# Patient Record
Sex: Male | Born: 1970 | Race: White | Hispanic: No | Marital: Single
Health system: Southern US, Community
[De-identification: ages and names within clinical notes are randomized; demographics above are authoritative.]

---

## 1998-08-18 ENCOUNTER — Emergency Department (HOSPITAL_COMMUNITY): Admission: EM | Admit: 1998-08-18 | Discharge: 1998-08-18 | Payer: Self-pay | Admitting: Emergency Medicine

## 1998-09-02 ENCOUNTER — Emergency Department (HOSPITAL_COMMUNITY): Admission: EM | Admit: 1998-09-02 | Discharge: 1998-09-03 | Payer: Self-pay | Admitting: Emergency Medicine

## 2013-03-18 ENCOUNTER — Telehealth: Payer: Self-pay | Admitting: Gastroenterology

## 2013-03-18 NOTE — Telephone Encounter (Signed)
Wrong pt

## 2015-05-09 DIAGNOSIS — M109 Gout, unspecified: Secondary | ICD-10-CM | POA: Diagnosis not present

## 2015-06-02 DIAGNOSIS — M79672 Pain in left foot: Secondary | ICD-10-CM | POA: Diagnosis not present

## 2015-06-02 DIAGNOSIS — M25512 Pain in left shoulder: Secondary | ICD-10-CM | POA: Diagnosis not present

## 2015-08-05 DIAGNOSIS — M79672 Pain in left foot: Secondary | ICD-10-CM | POA: Diagnosis not present

## 2015-08-16 DIAGNOSIS — M109 Gout, unspecified: Secondary | ICD-10-CM | POA: Diagnosis not present

## 2015-08-18 DIAGNOSIS — M7661 Achilles tendinitis, right leg: Secondary | ICD-10-CM | POA: Diagnosis not present

## 2015-08-22 DIAGNOSIS — M7661 Achilles tendinitis, right leg: Secondary | ICD-10-CM | POA: Diagnosis not present

## 2015-08-24 DIAGNOSIS — M7661 Achilles tendinitis, right leg: Secondary | ICD-10-CM | POA: Diagnosis not present

## 2015-08-29 DIAGNOSIS — M7661 Achilles tendinitis, right leg: Secondary | ICD-10-CM | POA: Diagnosis not present

## 2015-09-05 DIAGNOSIS — M7661 Achilles tendinitis, right leg: Secondary | ICD-10-CM | POA: Diagnosis not present

## 2015-09-09 DIAGNOSIS — M7661 Achilles tendinitis, right leg: Secondary | ICD-10-CM | POA: Diagnosis not present

## 2015-09-15 DIAGNOSIS — M7661 Achilles tendinitis, right leg: Secondary | ICD-10-CM | POA: Diagnosis not present

## 2015-10-31 DIAGNOSIS — M109 Gout, unspecified: Secondary | ICD-10-CM | POA: Diagnosis not present

## 2015-11-14 DIAGNOSIS — M25512 Pain in left shoulder: Secondary | ICD-10-CM | POA: Diagnosis not present

## 2015-11-14 DIAGNOSIS — M79672 Pain in left foot: Secondary | ICD-10-CM | POA: Diagnosis not present

## 2015-11-16 DIAGNOSIS — M79672 Pain in left foot: Secondary | ICD-10-CM | POA: Diagnosis not present

## 2015-11-16 DIAGNOSIS — M25472 Effusion, left ankle: Secondary | ICD-10-CM | POA: Diagnosis not present

## 2015-11-22 DIAGNOSIS — M7661 Achilles tendinitis, right leg: Secondary | ICD-10-CM | POA: Diagnosis not present

## 2015-11-28 DIAGNOSIS — M25512 Pain in left shoulder: Secondary | ICD-10-CM | POA: Diagnosis not present

## 2015-11-28 DIAGNOSIS — M19012 Primary osteoarthritis, left shoulder: Secondary | ICD-10-CM | POA: Diagnosis not present

## 2015-12-09 DIAGNOSIS — Z23 Encounter for immunization: Secondary | ICD-10-CM | POA: Diagnosis not present

## 2015-12-12 DIAGNOSIS — M6702 Short Achilles tendon (acquired), left ankle: Secondary | ICD-10-CM | POA: Diagnosis not present

## 2015-12-12 DIAGNOSIS — K219 Gastro-esophageal reflux disease without esophagitis: Secondary | ICD-10-CM | POA: Diagnosis not present

## 2015-12-12 DIAGNOSIS — M7732 Calcaneal spur, left foot: Secondary | ICD-10-CM | POA: Diagnosis not present

## 2015-12-12 DIAGNOSIS — M7662 Achilles tendinitis, left leg: Secondary | ICD-10-CM | POA: Diagnosis not present

## 2015-12-12 DIAGNOSIS — Z79899 Other long term (current) drug therapy: Secondary | ICD-10-CM | POA: Diagnosis not present

## 2015-12-12 DIAGNOSIS — R2242 Localized swelling, mass and lump, left lower limb: Secondary | ICD-10-CM | POA: Diagnosis not present

## 2015-12-12 DIAGNOSIS — M25572 Pain in left ankle and joints of left foot: Secondary | ICD-10-CM | POA: Diagnosis not present

## 2015-12-19 DIAGNOSIS — K5900 Constipation, unspecified: Secondary | ICD-10-CM | POA: Diagnosis not present

## 2015-12-20 DIAGNOSIS — K5909 Other constipation: Secondary | ICD-10-CM | POA: Diagnosis not present

## 2015-12-20 DIAGNOSIS — K921 Melena: Secondary | ICD-10-CM | POA: Diagnosis not present

## 2015-12-20 DIAGNOSIS — K5730 Diverticulosis of large intestine without perforation or abscess without bleeding: Secondary | ICD-10-CM | POA: Diagnosis not present

## 2015-12-20 DIAGNOSIS — R110 Nausea: Secondary | ICD-10-CM | POA: Diagnosis not present

## 2015-12-20 DIAGNOSIS — K5900 Constipation, unspecified: Secondary | ICD-10-CM | POA: Diagnosis not present

## 2015-12-20 DIAGNOSIS — R1084 Generalized abdominal pain: Secondary | ICD-10-CM | POA: Diagnosis not present

## 2015-12-20 DIAGNOSIS — K219 Gastro-esophageal reflux disease without esophagitis: Secondary | ICD-10-CM | POA: Diagnosis not present

## 2015-12-21 DIAGNOSIS — M79672 Pain in left foot: Secondary | ICD-10-CM | POA: Diagnosis not present

## 2016-01-02 DIAGNOSIS — F4322 Adjustment disorder with anxiety: Secondary | ICD-10-CM | POA: Diagnosis not present

## 2016-01-04 DIAGNOSIS — Z4789 Encounter for other orthopedic aftercare: Secondary | ICD-10-CM | POA: Diagnosis not present

## 2016-01-04 DIAGNOSIS — M7661 Achilles tendinitis, right leg: Secondary | ICD-10-CM | POA: Diagnosis not present

## 2016-01-09 DIAGNOSIS — F4322 Adjustment disorder with anxiety: Secondary | ICD-10-CM | POA: Diagnosis not present

## 2016-01-25 DIAGNOSIS — M79672 Pain in left foot: Secondary | ICD-10-CM | POA: Diagnosis not present

## 2016-01-25 DIAGNOSIS — Z4789 Encounter for other orthopedic aftercare: Secondary | ICD-10-CM | POA: Diagnosis not present

## 2016-01-31 DIAGNOSIS — M7662 Achilles tendinitis, left leg: Secondary | ICD-10-CM | POA: Diagnosis not present

## 2016-01-31 DIAGNOSIS — R221 Localized swelling, mass and lump, neck: Secondary | ICD-10-CM | POA: Diagnosis not present

## 2016-02-01 DIAGNOSIS — M7662 Achilles tendinitis, left leg: Secondary | ICD-10-CM | POA: Diagnosis not present

## 2016-02-07 DIAGNOSIS — M109 Gout, unspecified: Secondary | ICD-10-CM | POA: Diagnosis not present

## 2016-02-08 DIAGNOSIS — M7662 Achilles tendinitis, left leg: Secondary | ICD-10-CM | POA: Diagnosis not present

## 2016-02-09 DIAGNOSIS — M7662 Achilles tendinitis, left leg: Secondary | ICD-10-CM | POA: Diagnosis not present

## 2016-02-13 DIAGNOSIS — M7662 Achilles tendinitis, left leg: Secondary | ICD-10-CM | POA: Diagnosis not present

## 2016-02-17 DIAGNOSIS — Z4789 Encounter for other orthopedic aftercare: Secondary | ICD-10-CM | POA: Diagnosis not present

## 2016-02-20 DIAGNOSIS — M7662 Achilles tendinitis, left leg: Secondary | ICD-10-CM | POA: Diagnosis not present

## 2016-02-22 DIAGNOSIS — M7662 Achilles tendinitis, left leg: Secondary | ICD-10-CM | POA: Diagnosis not present

## 2016-02-27 DIAGNOSIS — S46092A Other injury of muscle(s) and tendon(s) of the rotator cuff of left shoulder, initial encounter: Secondary | ICD-10-CM | POA: Diagnosis not present

## 2016-02-28 DIAGNOSIS — M7662 Achilles tendinitis, left leg: Secondary | ICD-10-CM | POA: Diagnosis not present

## 2016-02-29 DIAGNOSIS — M7662 Achilles tendinitis, left leg: Secondary | ICD-10-CM | POA: Diagnosis not present

## 2016-03-05 DIAGNOSIS — M7662 Achilles tendinitis, left leg: Secondary | ICD-10-CM | POA: Diagnosis not present

## 2016-03-07 DIAGNOSIS — M7662 Achilles tendinitis, left leg: Secondary | ICD-10-CM | POA: Diagnosis not present

## 2016-03-09 DIAGNOSIS — S46092A Other injury of muscle(s) and tendon(s) of the rotator cuff of left shoulder, initial encounter: Secondary | ICD-10-CM | POA: Diagnosis not present

## 2016-03-12 DIAGNOSIS — M7662 Achilles tendinitis, left leg: Secondary | ICD-10-CM | POA: Diagnosis not present

## 2016-03-14 DIAGNOSIS — M7662 Achilles tendinitis, left leg: Secondary | ICD-10-CM | POA: Diagnosis not present

## 2016-03-15 DIAGNOSIS — S46092A Other injury of muscle(s) and tendon(s) of the rotator cuff of left shoulder, initial encounter: Secondary | ICD-10-CM | POA: Diagnosis not present

## 2016-03-16 DIAGNOSIS — M79672 Pain in left foot: Secondary | ICD-10-CM | POA: Diagnosis not present

## 2016-03-19 DIAGNOSIS — M7662 Achilles tendinitis, left leg: Secondary | ICD-10-CM | POA: Diagnosis not present

## 2016-03-21 DIAGNOSIS — M7662 Achilles tendinitis, left leg: Secondary | ICD-10-CM | POA: Diagnosis not present

## 2016-03-22 DIAGNOSIS — S46092A Other injury of muscle(s) and tendon(s) of the rotator cuff of left shoulder, initial encounter: Secondary | ICD-10-CM | POA: Diagnosis not present

## 2016-03-26 DIAGNOSIS — M7662 Achilles tendinitis, left leg: Secondary | ICD-10-CM | POA: Diagnosis not present

## 2016-03-27 DIAGNOSIS — S46092A Other injury of muscle(s) and tendon(s) of the rotator cuff of left shoulder, initial encounter: Secondary | ICD-10-CM | POA: Diagnosis not present

## 2016-03-28 DIAGNOSIS — M7662 Achilles tendinitis, left leg: Secondary | ICD-10-CM | POA: Diagnosis not present

## 2016-04-02 DIAGNOSIS — M7662 Achilles tendinitis, left leg: Secondary | ICD-10-CM | POA: Diagnosis not present

## 2016-04-04 DIAGNOSIS — S46092A Other injury of muscle(s) and tendon(s) of the rotator cuff of left shoulder, initial encounter: Secondary | ICD-10-CM | POA: Diagnosis not present

## 2016-04-06 DIAGNOSIS — M7662 Achilles tendinitis, left leg: Secondary | ICD-10-CM | POA: Diagnosis not present

## 2016-04-09 DIAGNOSIS — M7662 Achilles tendinitis, left leg: Secondary | ICD-10-CM | POA: Diagnosis not present

## 2016-04-10 DIAGNOSIS — S46092A Other injury of muscle(s) and tendon(s) of the rotator cuff of left shoulder, initial encounter: Secondary | ICD-10-CM | POA: Diagnosis not present

## 2016-04-11 DIAGNOSIS — M7662 Achilles tendinitis, left leg: Secondary | ICD-10-CM | POA: Diagnosis not present

## 2016-04-17 DIAGNOSIS — M7662 Achilles tendinitis, left leg: Secondary | ICD-10-CM | POA: Diagnosis not present

## 2016-04-18 DIAGNOSIS — R109 Unspecified abdominal pain: Secondary | ICD-10-CM | POA: Diagnosis not present

## 2016-04-18 DIAGNOSIS — N410 Acute prostatitis: Secondary | ICD-10-CM | POA: Diagnosis not present

## 2016-04-23 DIAGNOSIS — M7662 Achilles tendinitis, left leg: Secondary | ICD-10-CM | POA: Diagnosis not present

## 2016-04-24 DIAGNOSIS — S46092A Other injury of muscle(s) and tendon(s) of the rotator cuff of left shoulder, initial encounter: Secondary | ICD-10-CM | POA: Diagnosis not present

## 2016-04-25 DIAGNOSIS — M7662 Achilles tendinitis, left leg: Secondary | ICD-10-CM | POA: Diagnosis not present

## 2016-05-01 DIAGNOSIS — M7662 Achilles tendinitis, left leg: Secondary | ICD-10-CM | POA: Diagnosis not present

## 2016-05-02 DIAGNOSIS — S46092A Other injury of muscle(s) and tendon(s) of the rotator cuff of left shoulder, initial encounter: Secondary | ICD-10-CM | POA: Diagnosis not present

## 2016-05-03 DIAGNOSIS — M7662 Achilles tendinitis, left leg: Secondary | ICD-10-CM | POA: Diagnosis not present

## 2016-05-09 DIAGNOSIS — M7662 Achilles tendinitis, left leg: Secondary | ICD-10-CM | POA: Diagnosis not present

## 2016-05-10 DIAGNOSIS — M7662 Achilles tendinitis, left leg: Secondary | ICD-10-CM | POA: Diagnosis not present

## 2016-05-15 DIAGNOSIS — M7662 Achilles tendinitis, left leg: Secondary | ICD-10-CM | POA: Diagnosis not present

## 2016-05-17 DIAGNOSIS — M7662 Achilles tendinitis, left leg: Secondary | ICD-10-CM | POA: Diagnosis not present

## 2016-05-25 DIAGNOSIS — M7662 Achilles tendinitis, left leg: Secondary | ICD-10-CM | POA: Diagnosis not present

## 2016-05-29 DIAGNOSIS — M79672 Pain in left foot: Secondary | ICD-10-CM | POA: Diagnosis not present

## 2016-09-14 DIAGNOSIS — M79672 Pain in left foot: Secondary | ICD-10-CM | POA: Diagnosis not present

## 2016-10-18 DIAGNOSIS — M25522 Pain in left elbow: Secondary | ICD-10-CM | POA: Diagnosis not present

## 2016-10-23 DIAGNOSIS — F4322 Adjustment disorder with anxiety: Secondary | ICD-10-CM | POA: Diagnosis not present

## 2016-10-26 DIAGNOSIS — M79672 Pain in left foot: Secondary | ICD-10-CM | POA: Diagnosis not present

## 2016-11-01 DIAGNOSIS — F4322 Adjustment disorder with anxiety: Secondary | ICD-10-CM | POA: Diagnosis not present

## 2016-11-12 DIAGNOSIS — F4322 Adjustment disorder with anxiety: Secondary | ICD-10-CM | POA: Diagnosis not present

## 2016-11-16 DIAGNOSIS — M79672 Pain in left foot: Secondary | ICD-10-CM | POA: Diagnosis not present

## 2016-11-26 DIAGNOSIS — F4322 Adjustment disorder with anxiety: Secondary | ICD-10-CM | POA: Diagnosis not present

## 2016-12-06 DIAGNOSIS — Z23 Encounter for immunization: Secondary | ICD-10-CM | POA: Diagnosis not present

## 2016-12-14 DIAGNOSIS — M79672 Pain in left foot: Secondary | ICD-10-CM | POA: Diagnosis not present

## 2016-12-24 DIAGNOSIS — F4322 Adjustment disorder with anxiety: Secondary | ICD-10-CM | POA: Diagnosis not present

## 2017-02-04 DIAGNOSIS — F4322 Adjustment disorder with anxiety: Secondary | ICD-10-CM | POA: Diagnosis not present

## 2017-03-04 DIAGNOSIS — F4322 Adjustment disorder with anxiety: Secondary | ICD-10-CM | POA: Diagnosis not present

## 2017-03-06 DIAGNOSIS — J101 Influenza due to other identified influenza virus with other respiratory manifestations: Secondary | ICD-10-CM | POA: Diagnosis not present

## 2017-03-20 DIAGNOSIS — F4323 Adjustment disorder with mixed anxiety and depressed mood: Secondary | ICD-10-CM | POA: Diagnosis not present

## 2017-04-01 DIAGNOSIS — F4322 Adjustment disorder with anxiety: Secondary | ICD-10-CM | POA: Diagnosis not present

## 2017-04-04 DIAGNOSIS — F4323 Adjustment disorder with mixed anxiety and depressed mood: Secondary | ICD-10-CM | POA: Diagnosis not present

## 2017-06-10 DIAGNOSIS — F4322 Adjustment disorder with anxiety: Secondary | ICD-10-CM | POA: Diagnosis not present

## 2017-07-05 DIAGNOSIS — M1A0790 Idiopathic chronic gout, unspecified ankle and foot, without tophus (tophi): Secondary | ICD-10-CM | POA: Diagnosis not present

## 2017-07-05 DIAGNOSIS — R1084 Generalized abdominal pain: Secondary | ICD-10-CM | POA: Diagnosis not present

## 2017-07-08 DIAGNOSIS — F4322 Adjustment disorder with anxiety: Secondary | ICD-10-CM | POA: Diagnosis not present

## 2017-07-24 DIAGNOSIS — F4322 Adjustment disorder with anxiety: Secondary | ICD-10-CM | POA: Diagnosis not present

## 2017-08-28 DIAGNOSIS — F4322 Adjustment disorder with anxiety: Secondary | ICD-10-CM | POA: Diagnosis not present

## 2017-09-14 DIAGNOSIS — M25561 Pain in right knee: Secondary | ICD-10-CM | POA: Diagnosis not present

## 2017-09-17 DIAGNOSIS — R194 Change in bowel habit: Secondary | ICD-10-CM | POA: Diagnosis not present

## 2017-09-25 DIAGNOSIS — F4322 Adjustment disorder with anxiety: Secondary | ICD-10-CM | POA: Diagnosis not present

## 2017-09-26 DIAGNOSIS — R194 Change in bowel habit: Secondary | ICD-10-CM | POA: Diagnosis not present

## 2017-09-27 DIAGNOSIS — M25562 Pain in left knee: Secondary | ICD-10-CM | POA: Diagnosis not present

## 2017-10-04 DIAGNOSIS — F4322 Adjustment disorder with anxiety: Secondary | ICD-10-CM | POA: Diagnosis not present

## 2017-10-07 DIAGNOSIS — I808 Phlebitis and thrombophlebitis of other sites: Secondary | ICD-10-CM | POA: Diagnosis not present

## 2017-10-09 DIAGNOSIS — R2232 Localized swelling, mass and lump, left upper limb: Secondary | ICD-10-CM | POA: Diagnosis not present

## 2017-10-09 DIAGNOSIS — R590 Localized enlarged lymph nodes: Secondary | ICD-10-CM | POA: Diagnosis not present

## 2017-10-09 DIAGNOSIS — M79602 Pain in left arm: Secondary | ICD-10-CM | POA: Diagnosis not present

## 2017-10-09 DIAGNOSIS — G8911 Acute pain due to trauma: Secondary | ICD-10-CM | POA: Diagnosis not present

## 2017-10-09 DIAGNOSIS — S40022A Contusion of left upper arm, initial encounter: Secondary | ICD-10-CM | POA: Diagnosis not present

## 2017-10-09 DIAGNOSIS — Z79899 Other long term (current) drug therapy: Secondary | ICD-10-CM | POA: Diagnosis not present

## 2017-10-15 DIAGNOSIS — T148XXA Other injury of unspecified body region, initial encounter: Secondary | ICD-10-CM | POA: Diagnosis not present

## 2017-10-23 DIAGNOSIS — F4322 Adjustment disorder with anxiety: Secondary | ICD-10-CM | POA: Diagnosis not present

## 2017-10-31 DIAGNOSIS — R197 Diarrhea, unspecified: Secondary | ICD-10-CM | POA: Diagnosis not present

## 2017-11-07 DIAGNOSIS — F4322 Adjustment disorder with anxiety: Secondary | ICD-10-CM | POA: Diagnosis not present

## 2017-11-28 DIAGNOSIS — F4322 Adjustment disorder with anxiety: Secondary | ICD-10-CM | POA: Diagnosis not present

## 2017-12-02 DIAGNOSIS — R51 Headache: Secondary | ICD-10-CM | POA: Diagnosis not present

## 2017-12-02 DIAGNOSIS — R5383 Other fatigue: Secondary | ICD-10-CM | POA: Diagnosis not present

## 2017-12-02 DIAGNOSIS — B349 Viral infection, unspecified: Secondary | ICD-10-CM | POA: Diagnosis not present

## 2017-12-11 DIAGNOSIS — Z23 Encounter for immunization: Secondary | ICD-10-CM | POA: Diagnosis not present

## 2017-12-18 DIAGNOSIS — F4322 Adjustment disorder with anxiety: Secondary | ICD-10-CM | POA: Diagnosis not present

## 2018-01-14 DIAGNOSIS — F4324 Adjustment disorder with disturbance of conduct: Secondary | ICD-10-CM | POA: Diagnosis not present

## 2018-01-30 DIAGNOSIS — F4324 Adjustment disorder with disturbance of conduct: Secondary | ICD-10-CM | POA: Diagnosis not present

## 2018-03-11 DIAGNOSIS — F4324 Adjustment disorder with disturbance of conduct: Secondary | ICD-10-CM | POA: Diagnosis not present

## 2018-03-20 DIAGNOSIS — F4323 Adjustment disorder with mixed anxiety and depressed mood: Secondary | ICD-10-CM | POA: Diagnosis not present

## 2018-04-08 DIAGNOSIS — F4322 Adjustment disorder with anxiety: Secondary | ICD-10-CM | POA: Diagnosis not present

## 2018-04-21 DIAGNOSIS — F4322 Adjustment disorder with anxiety: Secondary | ICD-10-CM | POA: Diagnosis not present

## 2018-05-29 DIAGNOSIS — F4322 Adjustment disorder with anxiety: Secondary | ICD-10-CM | POA: Diagnosis not present

## 2018-09-25 DIAGNOSIS — L821 Other seborrheic keratosis: Secondary | ICD-10-CM | POA: Diagnosis not present

## 2018-09-25 DIAGNOSIS — D485 Neoplasm of uncertain behavior of skin: Secondary | ICD-10-CM | POA: Diagnosis not present

## 2018-09-25 DIAGNOSIS — D1801 Hemangioma of skin and subcutaneous tissue: Secondary | ICD-10-CM | POA: Diagnosis not present

## 2018-10-21 DIAGNOSIS — Z20828 Contact with and (suspected) exposure to other viral communicable diseases: Secondary | ICD-10-CM | POA: Diagnosis not present

## 2018-10-21 DIAGNOSIS — Z6830 Body mass index (BMI) 30.0-30.9, adult: Secondary | ICD-10-CM | POA: Diagnosis not present

## 2018-11-06 DIAGNOSIS — D485 Neoplasm of uncertain behavior of skin: Secondary | ICD-10-CM | POA: Diagnosis not present

## 2018-12-12 ENCOUNTER — Ambulatory Visit: Payer: BC Managed Care – PPO | Admitting: Family Medicine

## 2018-12-12 ENCOUNTER — Ambulatory Visit (INDEPENDENT_AMBULATORY_CARE_PROVIDER_SITE_OTHER)
Admission: RE | Admit: 2018-12-12 | Discharge: 2018-12-12 | Disposition: A | Discharge status: Home / Self Care | Payer: BC Managed Care – PPO | Source: Ambulatory Visit | Attending: Family Medicine | Admitting: Family Medicine

## 2018-12-12 ENCOUNTER — Encounter: Payer: Self-pay | Admitting: Family Medicine

## 2018-12-12 ENCOUNTER — Other Ambulatory Visit: Payer: Self-pay

## 2018-12-12 VITALS — BP 110/72 | HR 69 | Ht 74.0 in

## 2018-12-12 DIAGNOSIS — G8929 Other chronic pain: Secondary | ICD-10-CM | POA: Diagnosis not present

## 2018-12-12 DIAGNOSIS — M25561 Pain in right knee: Secondary | ICD-10-CM

## 2018-12-12 DIAGNOSIS — M25562 Pain in left knee: Secondary | ICD-10-CM

## 2018-12-12 MED ORDER — VITAMIN D (ERGOCALCIFEROL) 1.25 MG (50000 UNIT) PO CAPS: 50000.0000 [IU] | ORAL_CAPSULE | ORAL | 0 refills | Status: DC

## 2018-12-12 NOTE — Progress Notes (Signed)
[facility] [address] [place], [place] [address] Phone: [phone] Subjective:   IJacqualin Combes, am serving as a scribe for Dr. Hulan Saas.'   CC: Left knee pain  NUU:[id number]  Nicholas Watkins is a 48 y.o. male coming in with complaint of left knee pain since 4 weeks ago when playing ultimate frisbee. Pain over medial hamstring tendons. Played ultimate one week later and was hobbling afterwards. Patient walks on hard concrete floors. Pain has improved but is still occurring daily. Pain is sharp in character. Has been playing sports his entire life and has suffered other knee injuries. Has been told he has arthritis.       Social History   Socioeconomic History  . Marital status: Single    Spouse name: Not on file  . Number of children: Not on file  . Years of education: Not on file  . Highest education level: Not on file  Occupational History  . Not on file  Social Needs  . Financial resource strain: Not on file  . Food insecurity    Worry: Not on file    Inability: Not on file  . Transportation needs    Medical: Not on file    Non-medical: Not on file  Tobacco Use  . Smoking status: Not on file  Substance and Sexual Activity  . Alcohol use: Not on file  . Drug use: Not on file  . Sexual activity: Not on file  Lifestyle  . Physical activity    Days per week: Not on file    Minutes per session: Not on file  . Stress: Not on file  Relationships  . Social Herbalist on phone: Not on file    Gets together: Not on file    Attends religious service: Not on file    Active member of club or organization: Not on file    Attends meetings of clubs or organizations: Not on file    Relationship status: Not on file  Other Topics Concern  . Not on file  Social History Narrative  . Not on file   Not on File no known drug allergies No family history on file.  No family history of autoimmune       Current Outpatient  Medications (Other):  .  omeprazole (PRILOSEC) 10 MG capsule, Take 10 mg by mouth daily. .  Vitamin D, Ergocalciferol, (DRISDOL) 1.25 MG (50000 UT) CAPS capsule, Take 1 capsule (50,000 Units total) by mouth every 7 (seven) days.    Past medical history, social, surgical and family history all reviewed in electronic medical record.  No pertanent information unless stated regarding to the chief complaint.   Review of Systems:  No headache, visual changes, nausea, vomiting, diarrhea, constipation, dizziness, abdominal pain, skin rash, fevers, chills, night sweats, weight loss, swollen lymph nodes, body aches, joint swelling, chest pain, shortness of breath, mood changes.  Positive muscle aches  Objective  Blood pressure 110/72, pulse 69, height 6\' 2"  (1.88 m), SpO2 99 %. Systems examined below as of    General: No apparent distress alert and oriented x3 mood and affect normal, dressed appropriately.  HEENT: Pupils equal, extraocular movements intact  Respiratory: Patient's speak in full sentences and does not appear short of breath  Cardiovascular: No lower extremity edema, non tender, no erythema  Skin: Warm dry intact with no signs of infection or rash on extremities or on axial skeleton.  Abdomen: Soft nontender  Neuro: Cranial nerves II through XII are intact, neurovascularly intact in all extremities with 2+ DTRs and 2+ pulses.  Lymph: No lymphadenopathy of posterior or anterior cervical chain or axillae bilaterally.  Gait normal with good balance and coordination.  MSK:  Non tender with full range of motion and good stability and symmetric strength and tone of shoulders, elbows, wrist, hip, and ankles bilaterally.  Left knee exam shows the patient does have tender to palpation more over the hamstring tendon posteriorly to the medial joint line.  Patient does have some mild pain with resisted flexion of the knee.  Severe tenderness to palpation in this area.  Minimal pain over the joint  itself.  No crepitus.  No instability.  Negative McMurray's.  Limited musculoskeletal ultrasound was performed and interpreted by Judi Saa  Limited ultrasound of patient's medial joint line shows some mild narrowing, patient has mild narrowing of the patellofemoral joint.  Hamstring tendon intact but does have what appears to be as very small bursitis noted. Impression: Bursitis of the right knee medially   Impression and Recommendations:     This case required medical decision making of moderate complexity. The above documentation has been reviewed and is accurate and complete Judi Saa, DO       Note: This dictation was prepared with Dragon dictation along with smaller phrase technology. Any transcriptional errors that result from this process are unintentional.

## 2018-12-12 NOTE — Assessment & Plan Note (Signed)
Left knee pain.  Seems to be more of a bursitis on the medial aspect of the knee which is fairly unusual.  No significant bone mass noted and no abnormal vascularity.  Differential includes a posterior medial meniscal tear but did not see a truly on ultrasound today.  Patient does have some mild arthritis that could be contributing.  We discussed thigh compression sleeve, home exercise, topical anti-inflammatories.  X-rays pending.  I believe patient will do well with conservative therapy but if not had follow-up consider ultrasounding again and possible formal physical therapy.

## 2018-12-12 NOTE — Patient Instructions (Addendum)
Good to see you  Ice is yoru friend Thigh compression sleeve Once weekly Vitamin D See me in 5-6 weeks

## 2019-01-12 NOTE — Progress Notes (Signed)
[facility] [address] [place], [place] [address] Phone: [phone] Subjective:   I [name] am serving as a Education administrator for Dr. Hulan Saas.  This visit occurred during the SARS-CoV-2 public health emergency.  Safety protocols were in place, including screening questions prior to the visit, additional usage of staff PPE, and extensive cleaning of exam room while observing appropriate contact time as indicated for disinfecting solutions.   I'm seeing this patient by the request  of:  Orpah Melter, MD   CC: Knee pain follow-up  QAS:[id number]   12/12/2018 Left knee pain.  Seems to be more of a bursitis on the medial aspect of the knee which is fairly unusual.  No significant bone mass noted and no abnormal vascularity.  Differential includes a posterior medial meniscal tear but did not see a truly on ultrasound today.  Patient does have some mild arthritis that could be contributing.  We discussed thigh compression sleeve, home exercise, topical anti-inflammatories.  X-rays pending.  I believe patient will do well with conservative therapy but if not had follow-up consider ultrasounding again and possible formal physical therapy.  Update 01/13/2019 OMRAN Watkins is a 48 y.o. male coming in with complaint of bilateral knee pain. Patient states the knees are doing well. Patient believes he is making progress. Still experiencing pain with going up and down stairs.  Patient states that the pain seems to be left more than right.     Patient was previously seen and had more of a bursitis in the medial knee.  No past medical history on file. No past surgical history on file. Social History   Socioeconomic History  . Marital status: Single    Spouse name: Not on file  . Number of children: Not on file  . Years of education: Not on file  . Highest education level: Not on file  Occupational History  . Not on file  Tobacco Use  . Smoking status: Not  on file  Substance and Sexual Activity  . Alcohol use: Not on file  . Drug use: Not on file  . Sexual activity: Not on file  Other Topics Concern  . Not on file  Social History Narrative  . Not on file   Social Determinants of Health   Financial Resource Strain:   . Difficulty of Paying Living Expenses: Not on file  Food Insecurity:   . Worried About Charity fundraiser in the Last Year: Not on file  . Ran Out of Food in the Last Year: Not on file  Transportation Needs:   . Lack of Transportation (Medical): Not on file  . Lack of Transportation (Non-Medical): Not on file  Physical Activity:   . Days of Exercise per Week: Not on file  . Minutes of Exercise per Session: Not on file  Stress:   . Feeling of Stress : Not on file  Social Connections:   . Frequency of Communication with Friends and Family: Not on file  . Frequency of Social Gatherings with Friends and Family: Not on file  . Attends Religious Services: Not on file  . Active Member of Clubs or Organizations: Not on file  . Attends Archivist Meetings: Not on file  . Marital Status: Not on file   Not on File No family history on file.       Current Outpatient Medications (Other):  .  omeprazole (PRILOSEC) 10 MG capsule, Take 10 mg by mouth daily. Marland Kitchen  Vitamin D, Ergocalciferol, (DRISDOL) 1.25 MG (50000 UT) CAPS capsule, Take 1 capsule (50,000 Units total) by mouth every 7 (seven) days.    Past medical history, social, surgical and family history all reviewed in electronic medical record.  No pertanent information unless stated regarding to the chief complaint.   Review of Systems:  No headache, visual changes, nausea, vomiting, diarrhea, constipation, dizziness, abdominal pain, skin rash, fevers, chills, night sweats, weight loss, swollen lymph nodes, body aches, joint swelling, muscle aches, chest pain, shortness of breath, mood changes.   Objective  Blood pressure 110/70, pulse 81, height 6\' 2"   (1.88 m), weight 247 lb (112 kg), SpO2 99 %.   General: No apparent distress alert and oriented x3 mood and affect normal, dressed appropriately.  HEENT: Pupils equal, extraocular movements intact  Respiratory: Patient's speak in full sentences and does not appear short of breath  Cardiovascular: No lower extremity edema, non tender, no erythema  Skin: Warm dry intact with no signs of infection or rash on extremities or on axial skeleton.  Abdomen: Soft nontender  Neuro: Cranial nerves II through XII are intact, neurovascularly intact in all extremities with 2+ DTRs and 2+ pulses.  Lymph: No lymphadenopathy of posterior or anterior cervical chain or axillae bilaterally.  Gait normal with good balance and coordination.  MSK:  tender with range of motion and good stability and symmetric strength and tone of shoulders, elbows, wrist, hip and ankles bilaterally.  Right knee shows some patellofemoral grinding noted.  Positive patellar grind with mild lateral tracking noted.  Minimal tenderness noted to palpation.  Left knee has more pain on the medial aspect positive McMurray's. Patient still has some tightness of the hamstring compared to the contralateral side.  Limited musculoskeletal ultrasound was performed and interpreted by  Did ultrasound of patient's left knee shows that patient still has the cystic formation noted but now appears to be more of a perimeniscal cyst.  Likely secondary to a degenerative meniscal tear posteriorly with mild displacement noted.  This is different than previous exam but less hypoechoic changes in the area  After informed written and verbal consent, patient was seated on exam table. Left knee was prepped with alcohol swab and utilizing anterolateral approach, patient's left knee space was injected with 4:1  marcaine 0.5%: Kenalog 40mg /dL. Patient tolerated the procedure well without immediate complications.   Impression and Recommendations:      This case required medical decision making of moderate complexity. The above documentation has been reviewed and is accurate and complete Judi Saa, DO       Note: This dictation was prepared with Dragon dictation along with smaller phrase technology. Any transcriptional errors that result from this process are unintentional.

## 2019-01-13 ENCOUNTER — Ambulatory Visit: Payer: Self-pay

## 2019-01-13 ENCOUNTER — Encounter: Payer: Self-pay | Admitting: Family Medicine

## 2019-01-13 ENCOUNTER — Ambulatory Visit: Payer: BC Managed Care – PPO | Admitting: Family Medicine

## 2019-01-13 ENCOUNTER — Other Ambulatory Visit: Payer: Self-pay

## 2019-01-13 VITALS — BP 110/70 | HR 81 | Ht 74.0 in | Wt 247.0 lb

## 2019-01-13 DIAGNOSIS — M25562 Pain in left knee: Secondary | ICD-10-CM

## 2019-01-13 DIAGNOSIS — G8929 Other chronic pain: Secondary | ICD-10-CM

## 2019-01-13 NOTE — Assessment & Plan Note (Signed)
Patient given an injection today, tolerated the procedure well.  I do believe that there is a potential for complex medial posterior meniscal tear noted on ultrasound today.  Discussed which activities to doing which wants to avoid.  Follow-up again in 4 to 8 weeks

## 2019-01-13 NOTE — Patient Instructions (Addendum)
  [address], [address] [place],  [address] Phone [phone]   Good to see you Happy Holidays! See me again in 5-6 weeks   

## 2019-02-17 ENCOUNTER — Encounter: Payer: Self-pay | Admitting: Family Medicine

## 2019-02-17 ENCOUNTER — Ambulatory Visit: Payer: BC Managed Care – PPO | Admitting: Family Medicine

## 2019-02-17 ENCOUNTER — Other Ambulatory Visit: Payer: Self-pay

## 2019-02-17 DIAGNOSIS — M25562 Pain in left knee: Secondary | ICD-10-CM

## 2019-02-17 DIAGNOSIS — M545 Low back pain, unspecified: Secondary | ICD-10-CM | POA: Insufficient documentation

## 2019-02-17 DIAGNOSIS — G8929 Other chronic pain: Secondary | ICD-10-CM

## 2019-02-17 NOTE — Patient Instructions (Signed)
See me in 2 months Stretch after lifting

## 2019-02-17 NOTE — Assessment & Plan Note (Signed)
Low back pain likely multifactorial.  New problem for him.  Home exercises given today, discussed icing regimen.  Discussed proper lifting techniques.  Patient is worsening pain will see me again in 6 weeks

## 2019-02-17 NOTE — Assessment & Plan Note (Signed)
Patient is doing much better after the injection.  Patient has increased activity.  Has had some mild back pain recently.  New exercises for the hip flexor given.  Discussed which activities to do which was to avoid.  Patient is to increase activity as tolerated.  Patient can follow-up again in 2 months if not completely better.

## 2019-02-17 NOTE — Progress Notes (Signed)
[facility] [address] [place] [address] Phone: [phone] Subjective:   I Nicholas Watkins am serving as a Neurosurgeon for Dr. Antoine Primas.  This visit occurred during the SARS-CoV-2 public health emergency.  Safety protocols were in place, including screening questions prior to the visit, additional usage of staff PPE, and extensive cleaning of exam room while observing appropriate contact time as indicated for disinfecting solutions.   I'm seeing this patient by the request  of:  Joycelyn Rua, MD  CC: Knee pain follow-up  [id number]   01/13/2019 Patient given an injection today, tolerated the procedure well.  I do believe that there is a potential for complex medial posterior meniscal tear noted on ultrasound today.  Discussed which activities to doing which wants to avoid.  Follow-up again in 4 to 8 weeks  Nicholas Watkins is a 49 y.o. male coming in with complaint of left knee pain. Patient states he feels ok. Doesn't feel any worse.  Patient states doing relatively well overall.  Patient is to start increasing activity and has even played pickle ball recently.    Mild increase in low back pain.  Patient describes it as dull, throbbing aching sensation.  Patient does do a lot of repetitive lifting 1 or 2 times a month.  Feels like this seems to contribute.  Looking for some guidance on what to do  History reviewed. No pertinent past medical history. History reviewed. No pertinent surgical history. Social History   Socioeconomic History  . Marital status: Single    Spouse name: Not on file  . Number of children: Not on file  . Years of education: Not on file  . Highest education level: Not on file  Occupational History  . Not on file  Tobacco Use  . Smoking status: Not on file  Substance and Sexual Activity  . Alcohol use: Not on file  . Drug use: Not on file  . Sexual activity: Not on file  Other Topics Concern  . Not on file    Social History Narrative  . Not on file   Social Determinants of Health   Financial Resource Strain:   . Difficulty of Paying Living Expenses: Not on file  Food Insecurity:   . Worried About Programme researcher, broadcasting/film/video in the Last Year: Not on file  . Ran Out of Food in the Last Year: Not on file  Transportation Needs:   . Lack of Transportation (Medical): Not on file  . Lack of Transportation (Non-Medical): Not on file  Physical Activity:   . Days of Exercise per Week: Not on file  . Minutes of Exercise per Session: Not on file  Stress:   . Feeling of Stress : Not on file  Social Connections:   . Frequency of Communication with Friends and Family: Not on file  . Frequency of Social Gatherings with Friends and Family: Not on file  . Attends Religious Services: Not on file  . Active Member of Clubs or Organizations: Not on file  . Attends Banker Meetings: Not on file  . Marital Status: Not on file   Not on File History reviewed. No pertinent family history.       Current Outpatient Medications (Other):  .  omeprazole (PRILOSEC) 10 MG capsule, Take 10 mg by mouth daily. .  Vitamin D, Ergocalciferol, (DRISDOL) 1.25 MG (50000 UT) CAPS capsule, Take 1 capsule (50,000 Units total) by mouth every 7 (seven) days.  Past medical history, social, surgical and family history all reviewed in electronic medical record.  No pertanent information unless stated regarding to the chief complaint.   Review of Systems:  No headache, visual changes, nausea, vomiting, diarrhea, constipation, dizziness, abdominal pain, skin rash, fevers, chills, night sweats, weight loss, swollen lymph nodes, body aches, joint swelling, chest pain, shortness of breath, mood changes. POSITIVE muscle aches  Objective  Blood pressure 120/88, pulse 80, height 6\' 2"  (1.88 m), weight 252 lb (114.3 kg), SpO2 98 %.   General: No apparent distress alert and oriented x3 mood and affect normal, dressed  appropriately.  HEENT: Pupils equal, extraocular movements intact  Respiratory: Patient's speak in full sentences and does not appear short of breath  Cardiovascular: No lower extremity edema, non tender, no erythema  Skin: Warm dry intact with no signs of infection or rash on extremities or on axial skeleton.  Abdomen: Soft nontender  Neuro: Cranial nerves II through XII are intact, neurovascularly intact in all extremities with 2+ DTRs and 2+ pulses.  Lymph: No lymphadenopathy of posterior or anterior cervical chain or axillae bilaterally.  Gait normal with good balance and coordination.  MSK:  Non tender with full range of motion and good stability and symmetric strength and tone of shoulders, elbows, wrist, hip, and ankles bilaterally.  Left knee exam shows the patient does have full range of motion.  Very mild tenderness noted with McMurray still in the medial joint line.  Good stability no of the knee noted.  Back exam does have some loss of lordosis.  Tender to palpation paraspinal musculature.  Positive Corky Sox.  Negative straight leg test.   Impression and Recommendations:     This case required medical decision making of moderate complexity. The above documentation has been reviewed and is accurate and complete Lyndal Pulley, DO       Note: This dictation was prepared with Dragon dictation along with smaller phrase technology. Any transcriptional errors that result from this process are unintentional.

## 2019-04-15 ENCOUNTER — Ambulatory Visit: Payer: BC Managed Care – PPO | Admitting: Family Medicine

## 2019-04-15 DIAGNOSIS — Z0289 Encounter for other administrative examinations: Secondary | ICD-10-CM

## 2019-11-14 IMAGING — DX DG KNEE STANDING AP BILAT
1 series · 1 of 1 positions shown · non-contrast
Comparison: None.

CLINICAL DATA: Bilateral knee pain.

EXAM:
BILATERAL KNEES STANDING - 1 VIEW

[knee ap]
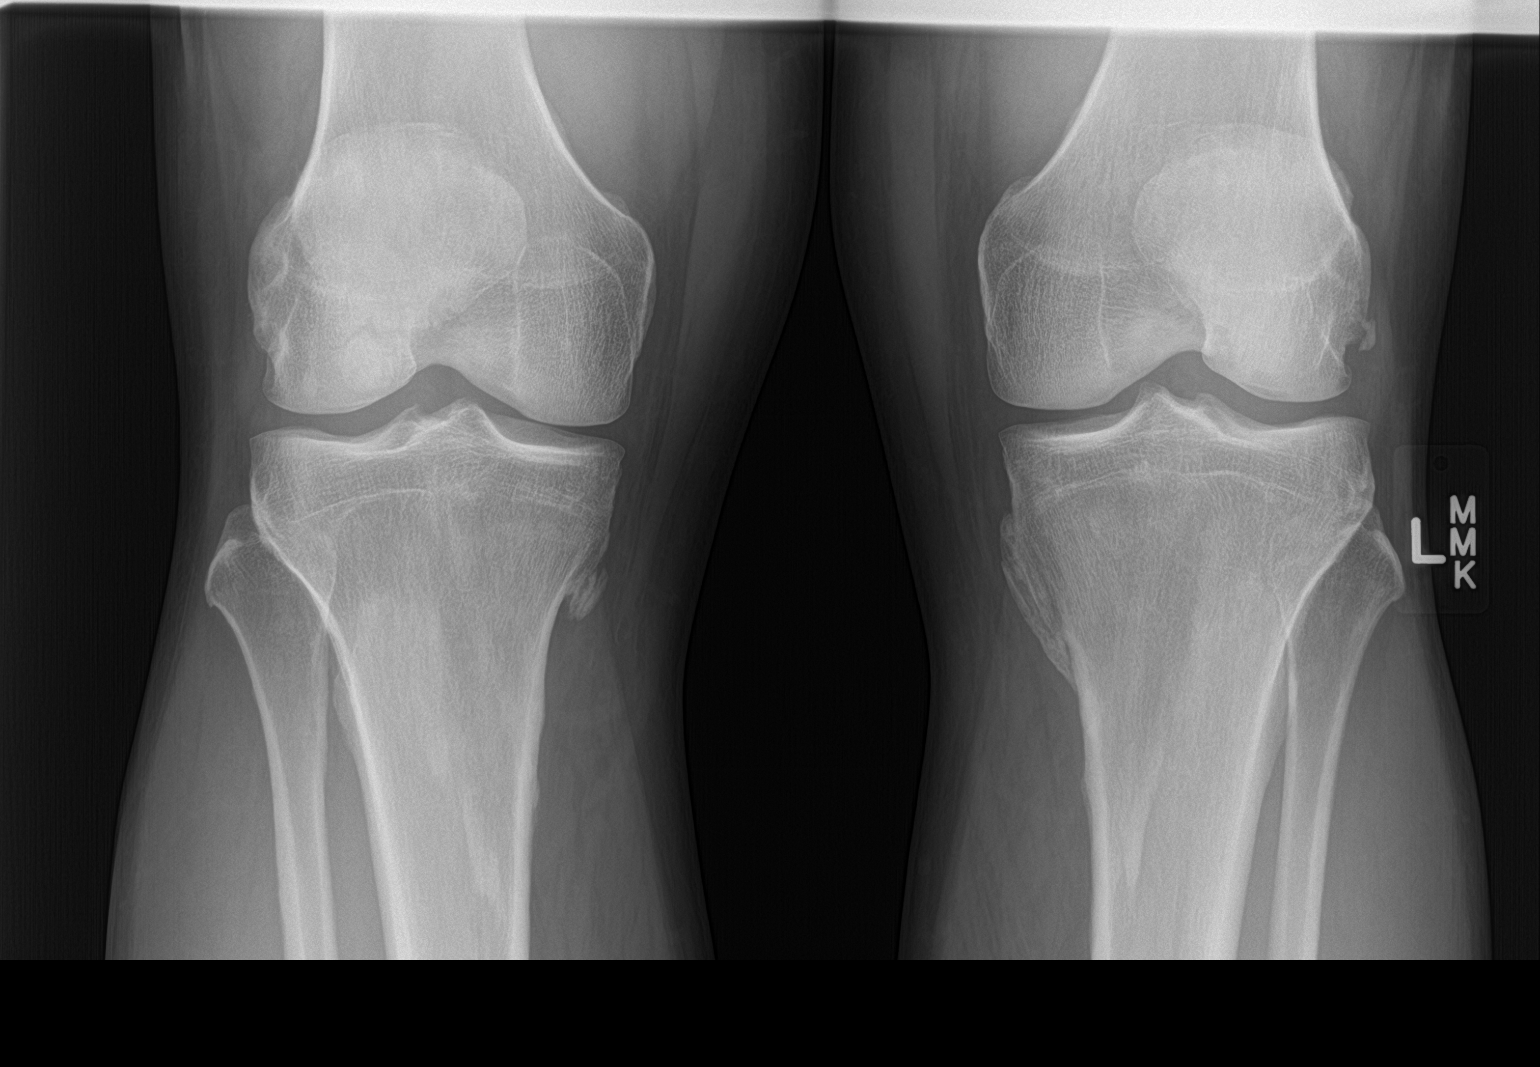

[1 of 1 positions shown; findings below may reference images not displayed]

FINDINGS: No evidence of fracture, dislocation, or joint effusion. No evidence
of arthropathy or other focal bone abnormality. Soft tissues are
unremarkable.
IMPRESSION: Negative.

## 2020-12-06 DIAGNOSIS — Z23 Encounter for immunization: Secondary | ICD-10-CM | POA: Diagnosis not present

## 2020-12-06 DIAGNOSIS — Z1331 Encounter for screening for depression: Secondary | ICD-10-CM | POA: Diagnosis not present

## 2020-12-06 DIAGNOSIS — Z1339 Encounter for screening examination for other mental health and behavioral disorders: Secondary | ICD-10-CM | POA: Diagnosis not present

## 2020-12-06 DIAGNOSIS — M109 Gout, unspecified: Secondary | ICD-10-CM | POA: Diagnosis not present

## 2020-12-06 DIAGNOSIS — M545 Low back pain, unspecified: Secondary | ICD-10-CM | POA: Diagnosis not present

## 2020-12-06 DIAGNOSIS — E669 Obesity, unspecified: Secondary | ICD-10-CM | POA: Diagnosis not present

## 2020-12-06 DIAGNOSIS — Z0000 Encounter for general adult medical examination without abnormal findings: Secondary | ICD-10-CM | POA: Diagnosis not present

## 2020-12-06 DIAGNOSIS — Z125 Encounter for screening for malignant neoplasm of prostate: Secondary | ICD-10-CM | POA: Diagnosis not present

## 2020-12-06 DIAGNOSIS — E739 Lactose intolerance, unspecified: Secondary | ICD-10-CM | POA: Diagnosis not present

## 2021-07-19 DIAGNOSIS — R0981 Nasal congestion: Secondary | ICD-10-CM | POA: Diagnosis not present

## 2021-07-19 DIAGNOSIS — R509 Fever, unspecified: Secondary | ICD-10-CM | POA: Diagnosis not present

## 2021-07-19 DIAGNOSIS — B349 Viral infection, unspecified: Secondary | ICD-10-CM | POA: Diagnosis not present

## 2021-12-01 DIAGNOSIS — E669 Obesity, unspecified: Secondary | ICD-10-CM | POA: Diagnosis not present

## 2021-12-01 DIAGNOSIS — Z125 Encounter for screening for malignant neoplasm of prostate: Secondary | ICD-10-CM | POA: Diagnosis not present

## 2021-12-01 DIAGNOSIS — M109 Gout, unspecified: Secondary | ICD-10-CM | POA: Diagnosis not present

## 2021-12-08 DIAGNOSIS — D696 Thrombocytopenia, unspecified: Secondary | ICD-10-CM | POA: Diagnosis not present

## 2021-12-08 DIAGNOSIS — R82998 Other abnormal findings in urine: Secondary | ICD-10-CM | POA: Diagnosis not present

## 2021-12-08 DIAGNOSIS — Z1331 Encounter for screening for depression: Secondary | ICD-10-CM | POA: Diagnosis not present

## 2021-12-08 DIAGNOSIS — Z0000 Encounter for general adult medical examination without abnormal findings: Secondary | ICD-10-CM | POA: Diagnosis not present

## 2021-12-08 DIAGNOSIS — Z1339 Encounter for screening examination for other mental health and behavioral disorders: Secondary | ICD-10-CM | POA: Diagnosis not present

## 2022-08-21 DIAGNOSIS — F4320 Adjustment disorder, unspecified: Secondary | ICD-10-CM | POA: Diagnosis not present

## 2022-08-29 DIAGNOSIS — F4320 Adjustment disorder, unspecified: Secondary | ICD-10-CM | POA: Diagnosis not present

## 2022-10-31 DIAGNOSIS — F4320 Adjustment disorder, unspecified: Secondary | ICD-10-CM | POA: Diagnosis not present

## 2022-12-10 DIAGNOSIS — F4320 Adjustment disorder, unspecified: Secondary | ICD-10-CM | POA: Diagnosis not present

## 2022-12-12 DIAGNOSIS — Z125 Encounter for screening for malignant neoplasm of prostate: Secondary | ICD-10-CM | POA: Diagnosis not present

## 2022-12-12 DIAGNOSIS — M109 Gout, unspecified: Secondary | ICD-10-CM | POA: Diagnosis not present

## 2022-12-12 DIAGNOSIS — Z1212 Encounter for screening for malignant neoplasm of rectum: Secondary | ICD-10-CM | POA: Diagnosis not present

## 2022-12-12 DIAGNOSIS — R7989 Other specified abnormal findings of blood chemistry: Secondary | ICD-10-CM | POA: Diagnosis not present

## 2022-12-19 DIAGNOSIS — K219 Gastro-esophageal reflux disease without esophagitis: Secondary | ICD-10-CM | POA: Diagnosis not present

## 2022-12-19 DIAGNOSIS — Z1331 Encounter for screening for depression: Secondary | ICD-10-CM | POA: Diagnosis not present

## 2022-12-19 DIAGNOSIS — Z0000 Encounter for general adult medical examination without abnormal findings: Secondary | ICD-10-CM | POA: Diagnosis not present

## 2022-12-19 DIAGNOSIS — Z23 Encounter for immunization: Secondary | ICD-10-CM | POA: Diagnosis not present

## 2022-12-19 DIAGNOSIS — Z1339 Encounter for screening examination for other mental health and behavioral disorders: Secondary | ICD-10-CM | POA: Diagnosis not present

## 2022-12-19 DIAGNOSIS — R82998 Other abnormal findings in urine: Secondary | ICD-10-CM | POA: Diagnosis not present

## 2023-01-07 DIAGNOSIS — F4320 Adjustment disorder, unspecified: Secondary | ICD-10-CM | POA: Diagnosis not present

## 2023-01-09 DIAGNOSIS — M25521 Pain in right elbow: Secondary | ICD-10-CM | POA: Diagnosis not present

## 2023-01-15 DIAGNOSIS — D229 Melanocytic nevi, unspecified: Secondary | ICD-10-CM | POA: Diagnosis not present

## 2023-01-15 DIAGNOSIS — D2261 Melanocytic nevi of right upper limb, including shoulder: Secondary | ICD-10-CM | POA: Diagnosis not present

## 2023-01-15 DIAGNOSIS — D225 Melanocytic nevi of trunk: Secondary | ICD-10-CM | POA: Diagnosis not present

## 2023-01-15 DIAGNOSIS — L821 Other seborrheic keratosis: Secondary | ICD-10-CM | POA: Diagnosis not present

## 2023-01-15 DIAGNOSIS — D485 Neoplasm of uncertain behavior of skin: Secondary | ICD-10-CM | POA: Diagnosis not present

## 2023-01-15 DIAGNOSIS — L578 Other skin changes due to chronic exposure to nonionizing radiation: Secondary | ICD-10-CM | POA: Diagnosis not present

## 2023-01-15 DIAGNOSIS — L814 Other melanin hyperpigmentation: Secondary | ICD-10-CM | POA: Diagnosis not present

## 2023-02-01 DIAGNOSIS — F4320 Adjustment disorder, unspecified: Secondary | ICD-10-CM | POA: Diagnosis not present

## 2023-03-01 ENCOUNTER — Ambulatory Visit: Payer: BC Managed Care – PPO | Admitting: Family Medicine

## 2023-03-01 ENCOUNTER — Other Ambulatory Visit: Payer: Self-pay

## 2023-03-01 VITALS — BP 148/88 | HR 68 | Ht 74.0 in | Wt 262.0 lb

## 2023-03-01 DIAGNOSIS — M25521 Pain in right elbow: Secondary | ICD-10-CM

## 2023-03-01 DIAGNOSIS — M7021 Olecranon bursitis, right elbow: Secondary | ICD-10-CM | POA: Insufficient documentation

## 2023-03-01 NOTE — Progress Notes (Signed)
   Rubin Payor, PhD, LAT, ATC acting as a scribe for Clementeen Graham, MD.  Nicholas Watkins is a 53 y.o. male who presents to [facility] today for R elbow pain ongoing since Thanksgiving morning. Hx of a "broken bone spur." His elbow was stuck in the corner for the bed, then he turned and felt a "pop." Pt was previously seen at [facility] and was advise to plan for watchful waiting. Pt locates pain to the posterior aspect of his R elbow.  Radiates: yes- if he hits  Paresthesia: yes- over posterior elbow Grip strength: WNL Aggravates: none Treatments tried:   Pertinent review of systems: No fevers or chills  Relevant historical information: Otherwise healthy   Exam:  BP (!) 148/88   Pulse 68   Ht 6\' 2"  (1.88 m)   Wt 262 lb (118.8 kg)   SpO2 99%   BMI 33.64 kg/m  General: Well Developed, well nourished, and in no acute distress.   MSK: Right elbow swelling overlying olecranon prominence. Nontender to palpation normal elbow motion.  Intact strength.    Lab and Radiology Results  Procedure: Real-time Ultrasound Guided aspiration and injection of right elbow olecranon bursitis Device: Philips Affiniti 50G/GE Logiq Images permanently stored and available for review in PACS Ultrasound evaluation prior to injection reveals moderate olecranon bursitis. Verbal informed consent obtained.  Discussed risks and benefits of procedure. Warned about infection, bleeding, hyperglycemia damage to structures among others. Patient expresses understanding and agreement Time-out conducted.   Noted no overlying erythema, induration, or other signs of local infection.   Skin prepped in a sterile fashion.   Local anesthesia: Topical Ethyl chloride.   With sterile technique and under real time ultrasound guidance: 0.5 mL of lidocaine injected into subcutaneous tissue achieving good anesthesia. Skin was again sterilized with isopropyl alcohol. 18-gauge needle was  used to access the bursa and to milliliters of clear blood-tinged fluid was aspirated. Syringe was exchanged and 40 mg of Kenalog and 1 mL of Marcaine was injected into the now decompressed bursa sac.   Completed without difficulty   Pain immediately resolved suggesting accurate placement of the medication.   Advised to call if fevers/chills, erythema, induration, drainage, or persistent bleeding.   Images permanently stored and available for review in the ultrasound unit.  Impression: Technically successful ultrasound guided injection.         Assessment and Plan: 53 y.o. male with right olecranon bursitis ongoing for about 6 weeks.  Patient had x-rays at [facility] which at the time of this note I do not have access to them.  Will request medical records so we can get access to those x-ray report.  Plan for aspiration and injection today.  Will use compression and padding.  Recheck in 1 month.   PDMP not reviewed this encounter. Orders Placed This Encounter  Procedures   [place] LIMITED JOINT SPACE STRUCTURES UP RIGHT(NO LINKED CHARGES)    Reason for Exam (SYMPTOM  OR DIAGNOSIS REQUIRED):   right elbow pain    Preferred imaging location?:   [facility]   No orders of the defined types were placed in this encounter.    Discussed warning signs or symptoms. Please see discharge instructions. Patient expresses understanding.   The above documentation has been reviewed and is accurate and complete Clementeen Graham, M.D.

## 2023-03-01 NOTE — Patient Instructions (Addendum)
Thank you for coming in today.  You received an injection today. Seek immediate medical attention if the joint becomes red, extremely painful, or is oozing fluid.  I recommend you obtained a compression sleeve to help with your joint problems. There are many options on the market however I recommend obtaining a Full Elbow Body Helix compression sleeve.  You can find information (including how to appropriate measure yourself for sizing) can be found at www.[email or web address].ch.  Many of these products are health savings account (HSA) eligible.   You can use the compression sleeve at any time throughout the day but is most important to use while being active as well as for 2 hours post-activity.   It is appropriate to ice following activity with the compression sleeve in place.   Check back in 1 month

## 2023-03-21 NOTE — Progress Notes (Unsigned)
   Rubin Payor, PhD, LAT, ATC acting as a scribe for Clementeen Graham, MD.  Nicholas Watkins is a 52 y.o. male who presents to [facility] at [facility] today for f/u R elbow pain. Pt was last seen by Dr. Denyse Amass on 03/01/23 and his R olecranon bursa was aspirated and injected. Records were requested from [facility] and he was advised to use compression.  Today, pt reports R elbow is about the same. He has been wearing a fabric compression sleeve. Posterior aspect of the R elbow is still tender.   Pertinent review of systems: No fevers or chills  Relevant historical information: Otherwise healthy   Exam:  BP 124/86   Pulse 77   Ht 6\' 2"  (1.88 m)   Wt 259 lb (117.5 kg)   SpO2 99%   BMI 33.25 kg/m  General: Well Developed, well nourished, and in no acute distress.   MSK: Right elbow normal. Minimally tender palpation posterior elbow.  Normal elbow motion.    Lab and Radiology Results  X-ray images right elbow obtained today personally and independently interpreted. Posterior olecranon spur is visible.  Does look like he has a healing fracture across the olecranon spur. 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  Await formal radiology review   Assessment and Plan: 54 y.o. male with chronic right elbow pain.  Patient did have olecranon bursitis in the last visit which is improving now after aspiration and injection.  He is using padding intermittently which is helpful.  X-ray today does show that he may have had a fracture across the posterior bone spur at the olecranon.  I do not think this is generating his pain but it certainly could be. Continue watchful waiting for now.  If needed consider advanced imaging.  PDMP not reviewed this encounter. Orders Placed This Encounter  Procedures   DG ELBOW COMPLETE RIGHT (3+VIEW)    Standing Status:   Future    Number of Occurrences:   1    Expiration Date:   04/19/2023    Reason for Exam (SYMPTOM  OR DIAGNOSIS REQUIRED):    right elbow pain    Preferred imaging location?:   [place]   No orders of the defined types were placed in this encounter.    Discussed warning signs or symptoms. Please see discharge instructions. Patient expresses understanding.   The above documentation has been reviewed and is accurate and complete Clementeen Graham, M.D.

## 2023-03-22 ENCOUNTER — Ambulatory Visit: Payer: BC Managed Care – PPO | Admitting: Family Medicine

## 2023-03-22 ENCOUNTER — Ambulatory Visit (INDEPENDENT_AMBULATORY_CARE_PROVIDER_SITE_OTHER): Payer: BC Managed Care – PPO

## 2023-03-22 VITALS — BP 124/86 | HR 77 | Ht 74.0 in | Wt 259.0 lb

## 2023-03-22 DIAGNOSIS — S42401D Unspecified fracture of lower end of right humerus, subsequent encounter for fracture with routine healing: Secondary | ICD-10-CM | POA: Diagnosis not present

## 2023-03-22 DIAGNOSIS — M25521 Pain in right elbow: Secondary | ICD-10-CM | POA: Diagnosis not present

## 2023-03-22 NOTE — Patient Instructions (Addendum)
 Thank you for coming in today.   Please get an Xray today before you leave

## 2023-03-28 NOTE — Progress Notes (Signed)
 Elbow x-ray shows a spur at the back of the elbow that could have been broken.  It looks old and like it is healing across the bone spur.  This should get better with time.

## 2023-10-25 NOTE — Progress Notes (Addendum)
 [facility] [address] [place] [address] Phone: [phone]   Assessment and Plan:     1. Right elbow pain (Primary) 2. 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  Potentially aggravated due to physical activity - Patient has history of fractured olecranon enthesophyte in 2024 causing a traumatic bursitis.  Bursitis resolved after aspiration in March 2025, but recurred over the past several weeks - Patient elected for repeat aspiration with [facility].  Tolerated well per note below - Continue to use compression when physically active - Reviewed patient's x-ray right elbow 03/22/2023 which showed chronic spur with linear lucency potentially representing subacute fracture, mild to moderate coronoid process spurring  Procedure: Ultrasound Guided Olecranon Bursa Drainage/Aspiration Side: Right Diagnosis: Traumatic olecranon bursitis US  Indication:  - accuracy is paramount for diagnosis - to ensure therapeutic efficacy or procedural success - to reduce procedural risk  After explaining the procedure, viable alternatives, risks, and answering any questions, consent was given verbally. The site was cleaned with chlorhexidine prep. An ultrasound transducer was placed on the olecranon bursa.  0.43ml of 1% lidocaine without epinephrine was injected for local anesthesia and using a large bore needle 15ml of serosanguinous fluid was removed.    The fluid was not sent for analysis. The syringe was exchanged and 1 mL Kenalog  40 mg/ML was injected into bursa site. The needle was removed, pressure dressing placed and post injection instructions were given including a discussion of likely return of pain today after the anesthetic wears off (with the possibility of worsened pain).  Pt was advised to call or return to clinic if these symptoms worsen or  fail to improve as anticipated. Images permanently stored.     Pertinent previous records reviewed include right elbow x-ray 2/20/125   Follow Up: As needed.  If effusion recurs, could follow-up for repeat aspiration.  Patient could reach out for orthopedic surgery referral   Subjective:   I, Nicholas Watkins, am serving as a Neurosurgeon for Doctor Nicholas Watkins  Chief Complaint: R elbow pain   HPI:   03/22/2023 Nicholas Watkins is a 53 y.o. male who presents to [facility] at [facility] today for f/u R elbow pain. Pt was last seen by Nicholas Watkins on 03/01/23 and his R olecranon bursa was aspirated and injected. Records were requested from [facility] and he was advised to use compression.   Today, pt reports R elbow is about the same. He has been wearing a fabric compression sleeve. Posterior aspect of the R elbow is still tender.    Pertinent review of systems: No fevers or chills   Relevant historical information: Otherwise healthy     Exam:  BP 124/86   Pulse 77   Ht 6' 2 (1.88 m)   Wt 259 lb (117.5 kg)   SpO2 99%   BMI 33.25 kg/m  General: Well Developed, well nourished, and in no acute distress.    MSK: Right elbow normal. Minimally tender palpation posterior elbow.  Normal elbow motion.  10/28/2023 Patient states 3 weeks of elbow swelling. Since he had it drained. He is able to do his activities with little to no pain. ROM is normal but pain when he bumps it on things     Relevant Historical Information: None pertinent  Additional pertinent review of systems negative.   Current Outpatient Medications:    omeprazole (PRILOSEC) 10 MG capsule,  Take 10 mg by mouth daily., Disp: , Rfl:    Objective:     Vitals:   10/28/23 1102  BP: 132/76  Pulse: 82  SpO2: 97%  Weight: 259 lb (117.5 kg)  Height: 6' 2 (1.88 m)      Body mass index is 33.25 kg/m.    Physical Exam:    General: Appears well, no acute distress, nontoxic and pleasant Neck:  FROM, no pain Neuro: sensation is intact distally with no deficits, strenghth is 5/5 in elbow flexors/extenders/supinator/pronators and wrist flexors/extensors Psych: no evidence of anxiety or depression  Right elbow: No deformity,  or muscle wasting Circular, mobile mass measuring 4 cm in diameter superficial to olecranon.  Mild TTP.  No erythema, warmth, active drainage, open lesion Normal Carrying angle ROM:0-140, supination and pronation 90 NTTP over triceps, ticeps tendon, olecranon, lat epicondyle, medial epicondyle, antecubital fossa, biceps tendon, supinator, pronator Negative tinnels over cubital tunnel No pain with resisted wrist and middle digit extension No pain with resisted wrist flexion No pain with resisted supination No pain with resisted pronation Negative valgus stress Negative varus stress Negative milking maneuver    Electronically signed by:  Nicholas [facility] 11:59 AM 10/28/23

## 2023-10-28 ENCOUNTER — Other Ambulatory Visit: Payer: Self-pay

## 2023-10-28 ENCOUNTER — Ambulatory Visit (INDEPENDENT_AMBULATORY_CARE_PROVIDER_SITE_OTHER): Admitting: Sports Medicine

## 2023-10-28 VITALS — BP 132/76 | HR 82 | Ht 74.0 in | Wt 259.0 lb

## 2023-10-28 DIAGNOSIS — M25521 Pain in right elbow: Secondary | ICD-10-CM

## 2023-10-28 DIAGNOSIS — M7021 Olecranon bursitis, right elbow: Secondary | ICD-10-CM | POA: Diagnosis not present
